# Patient Record
Sex: Male | Born: 1990 | Race: White | Hispanic: No | Marital: Single | Smoking: Current every day smoker
Health system: Southern US, Community
[De-identification: ages and names within clinical notes are randomized; demographics above are authoritative.]

---
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
## 2018-11-29 ENCOUNTER — Ambulatory Visit (HOSPITAL_COMMUNITY)
Admission: EM | Admit: 2018-11-29 | Discharge: 2018-11-29 | Disposition: A | Discharge status: Home / Self Care | Payer: Managed Care, Other (non HMO) | Attending: Emergency Medicine | Admitting: Emergency Medicine

## 2018-11-29 ENCOUNTER — Other Ambulatory Visit: Payer: Self-pay

## 2018-11-29 ENCOUNTER — Encounter (HOSPITAL_COMMUNITY): Payer: Self-pay | Admitting: Emergency Medicine

## 2018-11-29 DIAGNOSIS — T63441A Toxic effect of venom of bees, accidental (unintentional), initial encounter: Secondary | ICD-10-CM

## 2018-11-29 MED ORDER — TRIAMCINOLONE ACETONIDE 0.1 % EX CREA: 1.0000 "application " | TOPICAL_CREAM | Freq: Two times a day (BID) | CUTANEOUS | 0 refills | Status: AC

## 2018-11-29 MED ORDER — PREDNISONE 50 MG PO TABS: 50.0000 mg | ORAL_TABLET | Freq: Every day | ORAL | 0 refills | Status: AC

## 2018-11-29 NOTE — ED Provider Notes (Signed)
[facility]    CSN: [id number] Arrival date & time: 11/29/18  1902      History   Chief Complaint Chief Complaint  Patient presents with  . Insect Bite    HPI Hisao Doo is a 28 y.o. male no significant medical history presenting today for evaluation of a bee sting.  Patient states yesterday he was stung by a bee to his right hand.  Since he has had swelling and redness when he woke up in his right hand.  He had some occasional tingling and numbness in his left hand as well as in his lips.  Denies any throat or mouth swelling.  Denies history of significant reaction to bees, but states that his dad does.  He took Zyrtec earlier today and some ibuprofen.  He denies shortness of breath or difficulty breathing.  Denies rash elsewhere.  HPI  History reviewed. No pertinent past medical history.  There are no active problems to display for this patient.   History reviewed. No pertinent surgical history.     Home Medications    Prior to Admission medications   Medication Sig Start Date End Date Taking? Authorizing Provider  predniSONE (DELTASONE) 50 MG tablet Take 1 tablet (50 mg total) by mouth daily for 5 days. 11/29/18 12/04/18  Espn Zeman C, PA-C  triamcinolone cream (KENALOG) 0.1 % Apply 1 application topically 2 (two) times daily. 11/29/18   Laylani Pudwill, Elesa Hacker, PA-C    Family History Family History  Problem Relation Age of Onset  . Healthy Mother   . Hypertension Father     Social History Social History   Tobacco Use  . Smoking status: Current Every Day Smoker  . Smokeless tobacco: Never Used  Substance Use Topics  . Alcohol use: Yes  . Drug use: Never     Allergies   Penicillins   Review of Systems Review of Systems  Constitutional: Negative for fatigue and fever.  Eyes: Negative for redness, itching and visual disturbance.  Respiratory: Negative for shortness of breath.   Cardiovascular: Negative for chest pain and leg swelling.   Gastrointestinal: Negative for nausea and vomiting.  Musculoskeletal: Positive for joint swelling. Negative for arthralgias and myalgias.  Skin: Positive for color change and rash. Negative for wound.  Neurological: Negative for dizziness, syncope, weakness, light-headedness and headaches.     Physical Exam Triage Vital Signs ED Triage Vitals [11/29/18 1945]  Enc Vitals Group     BP (!) 150/90     Pulse Rate 86     Resp 18     Temp 98.2 F (36.8 C)     Temp Source Oral     SpO2 96 %     Weight      Height      Head Circumference      Peak Flow      Pain Score 3     Pain Loc      Pain Edu?      Excl. in [place]?    No data found.  Updated Vital Signs BP (!) 150/90 (BP Location: Right Arm)   Pulse 86   Temp 98.2 F (36.8 C) (Oral)   Resp 18   SpO2 96%   Visual Acuity Right Eye Distance:   Left Eye Distance:   Bilateral Distance:    Right Eye Near:   Left Eye Near:    Bilateral Near:     Physical Exam Vitals signs and nursing note reviewed.  Constitutional:  Appearance: He is well-developed.  HENT:     Head: Normocephalic and atraumatic.     Comments: No facial swelling noted    Mouth/Throat:     Comments: Oral mucosa pink and moist, no tonsillar enlargement or exudate. Posterior pharynx patent and nonerythematous, no uvula deviation or swelling. Normal phonation.  No soft palate swelling Eyes:     Conjunctiva/sclera: Conjunctivae normal.  Neck:     Musculoskeletal: Neck supple.  Cardiovascular:     Rate and Rhythm: Normal rate and regular rhythm.     Heart sounds: No murmur.  Pulmonary:     Effort: Pulmonary effort is normal. No respiratory distress.     Breath sounds: Normal breath sounds.     Comments: Breathing comfortably at rest, CTABL, no wheezing, rales or other adventitious sounds auscultated Abdominal:     Palpations: Abdomen is soft.     Tenderness: There is no abdominal tenderness.  Musculoskeletal:     Comments: Full active range of  motion of right hand and arm  Skin:    General: Skin is warm and dry.     Comments: Right hand with swelling erythema and warmth, visible punctate area of sting to medial aspect of dorsum of hand along fifth metacarpal, redness does not streak proximally  Radial pulse 2+  Neurological:     Mental Status: He is alert.      UC Treatments / Results  Labs (all labs ordered are listed, but only abnormal results are displayed) Labs Reviewed - No data to display  EKG   Radiology No results found.  Procedures Procedures (including critical care time)  Medications Ordered in UC Medications - No data to display  Initial Impression / Assessment and Plan / UC Course  I have reviewed the triage vital signs and the nursing notes.  Pertinent labs & imaging results that were available during my care of the patient were reviewed by me and considered in my medical decision making (see chart for details).     Patient with localized allergic reaction to right hand, given reported sensations of tingling to lips and elsewhere, will go ahead and initiate on oral prednisone.  Kenalog to use topically, continue antihistamines, supplement Benadryl at nighttime.  Monitor for gradual resolution over the next 4 to 5 days.  Follow-up if symptoms progressing or worsening.  No airway involvement at this time.  Would not expect progression given greater than 24 hours after initial sting.Discussed strict return precautions. Patient verbalized understanding and is agreeable with plan.  Final Clinical Impressions(s) / UC Diagnoses   Final diagnoses:  Bee sting, accidental or unintentional, initial encounter     Discharge Instructions     Begin prednisone daily for 5 days, take with food and in the morning if you are able May apply Kenalog cream to hand to further help with itching and swelling if not fully resolved with prednisone Continue antihistamines-Zyrtec in the morning, Benadryl at nighttime   Please monitor for gradual resolution over the next 4 to 5 days, follow-up if symptoms progressing, worsening, not improving with above   ED Prescriptions    Medication Sig Dispense Auth. Provider   predniSONE (DELTASONE) 50 MG tablet Take 1 tablet (50 mg total) by mouth daily for 5 days. 5 tablet Particia Strahm C, PA-C   triamcinolone cream (KENALOG) 0.1 % Apply 1 application topically 2 (two) times daily. 30 g Jabril Pursell, RidgewayHallie C, PA-C     Controlled Substance Prescriptions [place] Controlled Substance Registry consulted? Not Applicable  Lew DawesWieters, Aman Batley C, [place]PA-C 11/29/18 2035

## 2018-11-29 NOTE — Discharge Instructions (Signed)
Begin prednisone daily for 5 days, take with food and in the morning if you are able May apply Kenalog cream to hand to further help with itching and swelling if not fully resolved with prednisone Continue antihistamines-Zyrtec in the morning, Benadryl at nighttime  Please monitor for gradual resolution over the next 4 to 5 days, follow-up if symptoms progressing, worsening, not improving with above

## 2018-11-29 NOTE — ED Triage Notes (Signed)
Pt here for bee sting to right hand; pt sts increased swelling and redness today with some numbness; pt denies hx of allergic reaction

## 2019-08-08 ENCOUNTER — Ambulatory Visit: Payer: Self-pay | Attending: Internal Medicine

## 2019-08-08 DIAGNOSIS — Z23 Encounter for immunization: Secondary | ICD-10-CM

## 2019-08-08 NOTE — Progress Notes (Signed)
   Covid-19 Vaccination Clinic  Name:  Alex Harrell    MRN: [id number] DOB: 06-Apr-1991  08/08/2019  [name] was observed post Covid-19 immunization for 30 minutes based on pre-vaccination screening without incident. He was provided with Vaccine Information Sheet and instruction to access the V-Safe system.   [name] was instructed to call 911 with any severe reactions post vaccine: Marland Kitchen Difficulty breathing  . Swelling of face and throat  . A fast heartbeat  . A bad rash all over body  . Dizziness and weakness   Immunizations Administered    Name Date Dose VIS Date Route   Pfizer COVID-19 Vaccine 08/08/2019  1:26 PM 0.3 mL 04/26/2019 Intramuscular   Manufacturer: [employer], [employer]   Lot: [id number]   NDC: [address]-0

## 2019-09-02 ENCOUNTER — Ambulatory Visit: Payer: Self-pay | Attending: Internal Medicine

## 2019-09-02 DIAGNOSIS — Z23 Encounter for immunization: Secondary | ICD-10-CM

## 2019-09-02 NOTE — Progress Notes (Signed)
   Covid-19 Vaccination Clinic  Name:  Alex Harrell    MRN: [id number] DOB: [birth year]  09/02/2019  [name] was observed post Covid-19 immunization for 15 minutes without incident. He was provided with Vaccine Information Sheet and instruction to access the V-Safe system.   [name] was instructed to call 911 with any severe reactions post vaccine: Marland Kitchen Difficulty breathing  . Swelling of face and throat  . A fast heartbeat  . A bad rash all over body  . Dizziness and weakness   Immunizations Administered    Name Date Dose VIS Date Route   Pfizer COVID-19 Vaccine 09/02/2019  5:00 PM 0.3 mL 07/10/2018 Intramuscular   Manufacturer: [employer], [employer]   Lot: [id number]   NDC: [address]-8
